# Patient Record
Sex: Female | Born: 1935
Health system: Southern US, Community
[De-identification: ages and names within clinical notes are randomized; demographics above are authoritative.]

## PROBLEM LIST (undated history)

## (undated) DIAGNOSIS — E7800 Pure hypercholesterolemia, unspecified: Secondary | ICD-10-CM

## (undated) HISTORY — PX: CATARACT EXTRACTION W/ INTRAOCULAR LENS  IMPLANT, BILATERAL: SHX1307

## (undated) HISTORY — PX: ABDOMINAL SURGERY: SHX537

## (undated) HISTORY — PX: ABDOMINAL HYSTERECTOMY: SHX81

---

## 1998-08-24 ENCOUNTER — Ambulatory Visit (HOSPITAL_COMMUNITY): Admission: RE | Admit: 1998-08-24 | Discharge: 1998-08-24 | Payer: Self-pay | Admitting: *Deleted

## 1999-04-28 ENCOUNTER — Other Ambulatory Visit: Admission: RE | Admit: 1999-04-28 | Discharge: 1999-04-28 | Payer: Self-pay | Admitting: *Deleted

## 1999-08-29 ENCOUNTER — Encounter: Payer: Self-pay | Admitting: *Deleted

## 1999-08-29 ENCOUNTER — Ambulatory Visit (HOSPITAL_COMMUNITY): Admission: RE | Admit: 1999-08-29 | Discharge: 1999-08-29 | Payer: Self-pay | Admitting: *Deleted

## 1999-12-15 ENCOUNTER — Encounter: Admission: RE | Admit: 1999-12-15 | Discharge: 1999-12-15 | Payer: Self-pay | Admitting: *Deleted

## 1999-12-15 ENCOUNTER — Encounter: Payer: Self-pay | Admitting: *Deleted

## 2000-09-07 ENCOUNTER — Encounter: Payer: Self-pay | Admitting: *Deleted

## 2000-09-07 ENCOUNTER — Encounter: Admission: RE | Admit: 2000-09-07 | Discharge: 2000-09-07 | Payer: Self-pay | Admitting: *Deleted

## 2001-09-12 ENCOUNTER — Encounter: Admission: RE | Admit: 2001-09-12 | Discharge: 2001-09-12 | Payer: Self-pay | Admitting: *Deleted

## 2001-09-12 ENCOUNTER — Encounter: Payer: Self-pay | Admitting: *Deleted

## 2002-06-23 ENCOUNTER — Ambulatory Visit (HOSPITAL_COMMUNITY): Admission: RE | Admit: 2002-06-23 | Discharge: 2002-06-23 | Payer: Self-pay | Admitting: Gastroenterology

## 2002-09-15 ENCOUNTER — Encounter: Admission: RE | Admit: 2002-09-15 | Discharge: 2002-09-15 | Payer: Self-pay | Admitting: *Deleted

## 2002-09-15 ENCOUNTER — Encounter: Payer: Self-pay | Admitting: *Deleted

## 2003-03-19 ENCOUNTER — Other Ambulatory Visit: Admission: RE | Admit: 2003-03-19 | Discharge: 2003-03-19 | Payer: Self-pay | Admitting: *Deleted

## 2003-09-18 ENCOUNTER — Encounter: Admission: RE | Admit: 2003-09-18 | Discharge: 2003-09-18 | Payer: Self-pay | Admitting: *Deleted

## 2004-10-06 ENCOUNTER — Encounter: Admission: RE | Admit: 2004-10-06 | Discharge: 2004-10-06 | Payer: Self-pay | Admitting: *Deleted

## 2005-10-20 ENCOUNTER — Encounter: Admission: RE | Admit: 2005-10-20 | Discharge: 2005-10-20 | Payer: Self-pay | Admitting: *Deleted

## 2006-08-15 ENCOUNTER — Other Ambulatory Visit: Admission: RE | Admit: 2006-08-15 | Discharge: 2006-08-15 | Payer: Self-pay | Admitting: *Deleted

## 2006-10-23 ENCOUNTER — Encounter: Admission: RE | Admit: 2006-10-23 | Discharge: 2006-10-23 | Payer: Self-pay | Admitting: *Deleted

## 2007-08-22 ENCOUNTER — Other Ambulatory Visit: Admission: RE | Admit: 2007-08-22 | Discharge: 2007-08-22 | Payer: Self-pay | Admitting: Family Medicine

## 2007-10-25 ENCOUNTER — Encounter: Admission: RE | Admit: 2007-10-25 | Discharge: 2007-10-25 | Payer: Self-pay | Admitting: Family Medicine

## 2007-11-27 ENCOUNTER — Ambulatory Visit (HOSPITAL_COMMUNITY): Admission: RE | Admit: 2007-11-27 | Discharge: 2007-11-27 | Payer: Self-pay | Admitting: Obstetrics and Gynecology

## 2007-11-27 ENCOUNTER — Encounter (INDEPENDENT_AMBULATORY_CARE_PROVIDER_SITE_OTHER): Payer: Self-pay | Admitting: Obstetrics and Gynecology

## 2008-10-26 ENCOUNTER — Encounter: Admission: RE | Admit: 2008-10-26 | Discharge: 2008-10-26 | Payer: Self-pay | Admitting: Family Medicine

## 2009-10-21 ENCOUNTER — Encounter: Admission: RE | Admit: 2009-10-21 | Discharge: 2009-10-21 | Payer: Self-pay | Admitting: Family Medicine

## 2010-10-24 ENCOUNTER — Encounter
Admission: RE | Admit: 2010-10-24 | Discharge: 2010-10-24 | Payer: Self-pay | Source: Home / Self Care | Attending: Family Medicine | Admitting: Family Medicine

## 2011-03-21 NOTE — Op Note (Signed)
Kendra Davidson, Kendra Davidson                 ACCOUNT NO.:  [id number]   MEDICAL RECORD NO.:  [id number]          PATIENT TYPE:  AMB   LOCATION:  SDC                           FACILITY:  [facility]   PHYSICIAN:  Gerald Leitz, MD          DATE OF BIRTH:  07-Jun-1924   DATE OF PROCEDURE:  11/27/2007  DATE OF DISCHARGE:                               OPERATIVE REPORT   PREOPERATIVE DIAGNOSIS:  Postmenopausal bleeding, suspect endometrial  polyps.   POSTOPERATIVE DIAGNOSIS:  Postmenopausal bleeding, suspect endometrial  fibroids.   PROCEDURE:  Hysteroscopy, dilation and curettage, resection of  endometrial mass.   SURGEON:  Dr. Gerald Leitz.   ASSISTANT:  None.   ANESTHESIA:  MAC.   FINDINGS:  Endometrial mass, suspected fibroids.   SPECIMENS:  Endometrial curettings and endometrial mass.   DISPOSITION:  Specimen sent to pathology.   ESTIMATED BLOOD LOSS:  Minimal.   SORBITOL DEFICIT:  200 mL.   COMPLICATIONS:  None.   INDICATIONS:  This is an 77 year old with postmenopausal bleeding,  thickened endometrium, and suspected endometrial mass on ultrasound.  Informed consent was obtained.   PROCEDURE:  The patient was taken to the operating room where she was  placed under MAC anesthesia.  She was prepped and draped in the usual  sterile fashion.  A bivalve speculum was placed into the vaginal vault.  The cervix was grasped with a single-tooth tenaculum.  A paracervical  block was obtained with 10 mL of 0.25% Marcaine 5 mL injected at the 5  and 7 o'clock positions.  The uterus was sounded to 6 cm.  The cervix  was dilated with Hegar dilators up to a #15 dilator.  The hysteroscope  was introduced into the cervix with the findings of an endometrial mass,  suspected fibroid versus polyp.  The hysteroscope was removed and polyp  forceps were introduced.  The mass was unable to be removed with polyp  forceps.  The hysteroscope was then used.  Prior to insertion of the  hysteroscope, the cervix was  dilated to a #20 Hegar dilator.  The  hysteroscope was then inserted and the endometrial  mass was resected without difficulty.  All instruments were removed from  the patient's vagina.  Excellent hemostasis was noted.  The patient was  awakened from anesthesia and taken to the recovery room awake and in  stable condition.      Gerald Leitz, MD  Electronically Signed     TC/MEDQ  D:  11/27/2007  T:  11/27/2007  Job:  [id number]

## 2011-03-21 NOTE — H&P (Signed)
Kendra Davidson, Kendra Davidson                 ACCOUNT NO.:  [id number]   MEDICAL RECORD NO.:  [id number]           PATIENT TYPE:   LOCATION:                                FACILITY:  [facility]   PHYSICIAN:  Gerald Leitz, MD          DATE OF BIRTH:  [birth year]   DATE OF ADMISSION:  DATE OF DISCHARGE:                              HISTORY & PHYSICAL   This is an 77 year old G zero with postmenopausal bleeding.  She has had  spotting on a daily basis.  Ultrasound performed on December 8 shows the  endometrium measured 1.44 cm.  The patient has multiple fibroids, the  largest of which is 3.2 cm.  She also had two hyperechoic masses  identified in the endometrium, one was 1.3 and 1.4 cm, with positive  blood flow consistent with possible polyp, left ovary appeared normal,  right ovary surgically absent.   PAST OB HISTORY:  Negative.   PAST GYN HISTORY:  No history of sexually transmitted diseases.  No  history of abnormal Pap smears.   PAST MEDICAL HISTORY:  Hypercholesterolemia.   PAST SURGICAL HISTORY:  Right salpingo-oophorectomy in 1964, D and C x2.   MEDICATIONS:  1. Zocor.  2. Aspirin.   ALLERGIES:  CODEINE, SULFA, questionable reaction to aspirin.   SOCIAL HISTORY:  The patient is widowed.  She denies tobacco, alcohol or  illicit drug use.   FAMILY HISTORY:  Positive for sister with breast cancer diagnosed at the  age of 59.   REVIEW OF SYSTEMS:  Negative except as stated in history of current  illness.   PHYSICAL EXAM:  VITAL SIGNS:  Blood pressure 128/74, weight 117 pounds.  CARDIOVASCULAR:  Regular rate and rhythm.  LUNGS:  Clear to auscultation bilaterally.  ABDOMEN:  Soft, nontender, nondistended.  No masses.  PELVIC:  Atrophic external female genitalia.  No vulvar, vaginal or  cervical lesions noted.  No blood in vaginal vault.  Bimanual exam  reveals normal size uterus with no adnexal masses or tenderness.   IMPRESSION AND PLAN:  Postmenopausal bleeding most likely secondary  to  endometrial polyps.  Recommend hysteroscopy, D and C and removal of  polyps.  The patient voiced understanding.  Risks, benefits and  alternatives of  surgery were discussed with the patient including but not limited to  infection, bleeding, possible uterine perforation with need for surgery.  The patient voiced understanding and desires to proceed with  hysteroscopy, D and C and removal of endometrial polyps.      Gerald Leitz, MD  Electronically Signed     TC/MEDQ  D:  11/22/2007  T:  11/22/2007  Job:  [id number]

## 2011-03-24 NOTE — Op Note (Signed)
   Kendra Davidson, Kendra Davidson                          ACCOUNT NO.:  [id number]   MEDICAL RECORD NO.:  [id number]                   PATIENT TYPE:  AMB   LOCATION:  ENDO                                 FACILITY:  [facility]   PHYSICIAN:  James L. Malon Kindle., M.D.          DATE OF BIRTH:  [birth year]   DATE OF PROCEDURE:  DATE OF DISCHARGE:                                 OPERATIVE REPORT   PROCEDURE:  Colonoscopy.   MEDICATIONS:  Fentanyl 50 mcg, Versed 6 mg IV.   INDICATIONS FOR PROCEDURE:  Colon cancer screening.   SCOPE:  Olympus pediatric video colonoscope.   DESCRIPTION OF PROCEDURE:  The procedure had been explained to the patient  and consent obtained. With the patient in the left lateral decubitus  position, the Olympus pediatric video colonoscope was inserted and advanced  under direct visualization. The prep was excellent. We were able to reach  the cecum without difficulty. The ileocecal valve and appendiceal orifice  were seen. The scope was withdrawn and the cecum, ascending colon, hepatic  flexure, transverse colon, splenic flexure, descending and sigmoid colon  were seen well. Only scattered diverticula, no polyps were seen throughout  the entire colon. The rectum was also examined and was free of polyps. The  scope was withdrawn and the patient tolerated the procedure well.   ASSESSMENT:  Essentially normal colonoscopy with no polyps.   PLAN:  Would recommend yearly Hemoccults. Consider screening colon again  with a colonoscopy or sigmoidoscopy in 5-10 years.                                               James L. Malon Kindle., M.D.    [name]  D:  06/23/2002  T:  06/23/2002  Job:  [id number]   cc:   Al Decant. Janey Greaser, M.D.

## 2011-07-27 LAB — CBC
MCV: 91.8
Platelets: 227
RDW: 12.7
WBC: 8.9

## 2011-07-27 LAB — URINALYSIS, ROUTINE W REFLEX MICROSCOPIC
Ketones, ur: NEGATIVE
Nitrite: NEGATIVE
Protein, ur: NEGATIVE
Urobilinogen, UA: 0.2

## 2011-07-27 LAB — DIFFERENTIAL
Basophils Absolute: 0
Basophils Relative: 0
Eosinophils Absolute: 0.1
Lymphs Abs: 2
Neutrophils Relative %: 68

## 2011-07-27 LAB — BASIC METABOLIC PANEL
BUN: 14
Chloride: 102
Creatinine, Ser: 0.79
Glucose, Bld: 96

## 2011-11-28 ENCOUNTER — Other Ambulatory Visit: Payer: Self-pay | Admitting: Family Medicine

## 2011-11-28 DIAGNOSIS — Z1231 Encounter for screening mammogram for malignant neoplasm of breast: Secondary | ICD-10-CM

## 2011-12-27 ENCOUNTER — Ambulatory Visit: Payer: Self-pay

## 2011-12-27 ENCOUNTER — Ambulatory Visit
Admission: RE | Admit: 2011-12-27 | Discharge: 2011-12-27 | Disposition: A | Discharge status: Home / Self Care | Payer: Medicare Other | Source: Ambulatory Visit | Attending: Family Medicine | Admitting: Family Medicine

## 2011-12-27 DIAGNOSIS — Z1231 Encounter for screening mammogram for malignant neoplasm of breast: Secondary | ICD-10-CM

## 2014-12-29 ENCOUNTER — Other Ambulatory Visit: Payer: Self-pay | Admitting: Family Medicine

## 2014-12-30 ENCOUNTER — Other Ambulatory Visit: Payer: Self-pay | Admitting: Family Medicine

## 2014-12-30 DIAGNOSIS — R0989 Other specified symptoms and signs involving the circulatory and respiratory systems: Secondary | ICD-10-CM

## 2014-12-31 LAB — CYTOLOGY - PAP

## 2015-01-04 ENCOUNTER — Ambulatory Visit
Admission: RE | Admit: 2015-01-04 | Discharge: 2015-01-04 | Disposition: A | Discharge status: Home / Self Care | Payer: Medicare Other | Source: Ambulatory Visit | Attending: Family Medicine | Admitting: Family Medicine

## 2015-01-04 DIAGNOSIS — R0989 Other specified symptoms and signs involving the circulatory and respiratory systems: Secondary | ICD-10-CM

## 2016-01-18 DIAGNOSIS — H401112 Primary open-angle glaucoma, right eye, moderate stage: Secondary | ICD-10-CM | POA: Diagnosis not present

## 2016-01-18 DIAGNOSIS — H401122 Primary open-angle glaucoma, left eye, moderate stage: Secondary | ICD-10-CM | POA: Diagnosis not present

## 2016-01-18 DIAGNOSIS — H02102 Unspecified ectropion of right lower eyelid: Secondary | ICD-10-CM | POA: Diagnosis not present

## 2016-01-18 DIAGNOSIS — H02403 Unspecified ptosis of bilateral eyelids: Secondary | ICD-10-CM | POA: Diagnosis not present

## 2016-01-25 DIAGNOSIS — R69 Illness, unspecified: Secondary | ICD-10-CM | POA: Diagnosis not present

## 2016-02-28 DIAGNOSIS — H16213 Exposure keratoconjunctivitis, bilateral: Secondary | ICD-10-CM | POA: Diagnosis not present

## 2016-02-28 DIAGNOSIS — H02115 Cicatricial ectropion of left lower eyelid: Secondary | ICD-10-CM | POA: Diagnosis not present

## 2016-02-28 DIAGNOSIS — H02535 Eyelid retraction left lower eyelid: Secondary | ICD-10-CM | POA: Diagnosis not present

## 2016-02-28 DIAGNOSIS — H02532 Eyelid retraction right lower eyelid: Secondary | ICD-10-CM | POA: Diagnosis not present

## 2016-02-28 DIAGNOSIS — H11433 Conjunctival hyperemia, bilateral: Secondary | ICD-10-CM | POA: Diagnosis not present

## 2016-02-28 DIAGNOSIS — H02112 Cicatricial ectropion of right lower eyelid: Secondary | ICD-10-CM | POA: Diagnosis not present

## 2016-04-10 DIAGNOSIS — H02535 Eyelid retraction left lower eyelid: Secondary | ICD-10-CM | POA: Diagnosis not present

## 2016-04-10 DIAGNOSIS — H02112 Cicatricial ectropion of right lower eyelid: Secondary | ICD-10-CM | POA: Diagnosis not present

## 2016-04-10 DIAGNOSIS — H01003 Unspecified blepharitis right eye, unspecified eyelid: Secondary | ICD-10-CM | POA: Diagnosis not present

## 2016-04-10 DIAGNOSIS — H00023 Hordeolum internum right eye, unspecified eyelid: Secondary | ICD-10-CM | POA: Diagnosis not present

## 2016-04-10 DIAGNOSIS — H02135 Senile ectropion of left lower eyelid: Secondary | ICD-10-CM | POA: Diagnosis not present

## 2016-04-10 DIAGNOSIS — H02132 Senile ectropion of right lower eyelid: Secondary | ICD-10-CM | POA: Diagnosis not present

## 2016-04-10 DIAGNOSIS — H01006 Unspecified blepharitis left eye, unspecified eyelid: Secondary | ICD-10-CM | POA: Diagnosis not present

## 2016-04-10 DIAGNOSIS — H0289 Other specified disorders of eyelid: Secondary | ICD-10-CM | POA: Diagnosis not present

## 2016-04-10 DIAGNOSIS — H00026 Hordeolum internum left eye, unspecified eyelid: Secondary | ICD-10-CM | POA: Diagnosis not present

## 2016-04-10 DIAGNOSIS — H02115 Cicatricial ectropion of left lower eyelid: Secondary | ICD-10-CM | POA: Diagnosis not present

## 2016-04-10 DIAGNOSIS — H02532 Eyelid retraction right lower eyelid: Secondary | ICD-10-CM | POA: Diagnosis not present

## 2016-04-10 DIAGNOSIS — H04523 Eversion of bilateral lacrimal punctum: Secondary | ICD-10-CM | POA: Diagnosis not present

## 2016-04-19 DIAGNOSIS — Z0000 Encounter for general adult medical examination without abnormal findings: Secondary | ICD-10-CM | POA: Diagnosis not present

## 2016-04-19 DIAGNOSIS — M859 Disorder of bone density and structure, unspecified: Secondary | ICD-10-CM | POA: Diagnosis not present

## 2016-04-19 DIAGNOSIS — M8580 Other specified disorders of bone density and structure, unspecified site: Secondary | ICD-10-CM | POA: Diagnosis not present

## 2016-04-19 DIAGNOSIS — E782 Mixed hyperlipidemia: Secondary | ICD-10-CM | POA: Diagnosis not present

## 2016-04-26 DIAGNOSIS — N63 Unspecified lump in breast: Secondary | ICD-10-CM | POA: Diagnosis not present

## 2016-04-26 DIAGNOSIS — M8580 Other specified disorders of bone density and structure, unspecified site: Secondary | ICD-10-CM | POA: Diagnosis not present

## 2016-04-26 DIAGNOSIS — Z8542 Personal history of malignant neoplasm of other parts of uterus: Secondary | ICD-10-CM | POA: Diagnosis not present

## 2016-04-26 DIAGNOSIS — E782 Mixed hyperlipidemia: Secondary | ICD-10-CM | POA: Diagnosis not present

## 2016-04-26 DIAGNOSIS — R69 Illness, unspecified: Secondary | ICD-10-CM | POA: Diagnosis not present

## 2016-04-26 DIAGNOSIS — Z1389 Encounter for screening for other disorder: Secondary | ICD-10-CM | POA: Diagnosis not present

## 2016-04-26 DIAGNOSIS — Z0000 Encounter for general adult medical examination without abnormal findings: Secondary | ICD-10-CM | POA: Diagnosis not present

## 2016-04-26 DIAGNOSIS — L719 Rosacea, unspecified: Secondary | ICD-10-CM | POA: Diagnosis not present

## 2016-05-01 DIAGNOSIS — H02413 Mechanical ptosis of bilateral eyelids: Secondary | ICD-10-CM | POA: Diagnosis not present

## 2016-05-01 DIAGNOSIS — H02831 Dermatochalasis of right upper eyelid: Secondary | ICD-10-CM | POA: Diagnosis not present

## 2016-07-12 DIAGNOSIS — Z23 Encounter for immunization: Secondary | ICD-10-CM | POA: Diagnosis not present

## 2016-07-25 DIAGNOSIS — Z961 Presence of intraocular lens: Secondary | ICD-10-CM | POA: Diagnosis not present

## 2016-07-25 DIAGNOSIS — H401122 Primary open-angle glaucoma, left eye, moderate stage: Secondary | ICD-10-CM | POA: Diagnosis not present

## 2016-07-25 DIAGNOSIS — H401111 Primary open-angle glaucoma, right eye, mild stage: Secondary | ICD-10-CM | POA: Diagnosis not present

## 2016-09-12 DIAGNOSIS — L908 Other atrophic disorders of skin: Secondary | ICD-10-CM | POA: Diagnosis not present

## 2016-09-12 DIAGNOSIS — E782 Mixed hyperlipidemia: Secondary | ICD-10-CM | POA: Diagnosis not present

## 2016-09-20 DIAGNOSIS — J069 Acute upper respiratory infection, unspecified: Secondary | ICD-10-CM | POA: Diagnosis not present

## 2017-01-10 DIAGNOSIS — J208 Acute bronchitis due to other specified organisms: Secondary | ICD-10-CM | POA: Diagnosis not present

## 2017-01-16 DIAGNOSIS — R69 Illness, unspecified: Secondary | ICD-10-CM | POA: Diagnosis not present

## 2017-01-31 DIAGNOSIS — H5340 Unspecified visual field defects: Secondary | ICD-10-CM | POA: Diagnosis not present

## 2017-01-31 DIAGNOSIS — H401132 Primary open-angle glaucoma, bilateral, moderate stage: Secondary | ICD-10-CM | POA: Diagnosis not present

## 2017-04-23 DIAGNOSIS — E782 Mixed hyperlipidemia: Secondary | ICD-10-CM | POA: Diagnosis not present

## 2017-04-30 DIAGNOSIS — R69 Illness, unspecified: Secondary | ICD-10-CM | POA: Diagnosis not present

## 2017-04-30 DIAGNOSIS — M8580 Other specified disorders of bone density and structure, unspecified site: Secondary | ICD-10-CM | POA: Diagnosis not present

## 2017-04-30 DIAGNOSIS — L719 Rosacea, unspecified: Secondary | ICD-10-CM | POA: Diagnosis not present

## 2017-04-30 DIAGNOSIS — E782 Mixed hyperlipidemia: Secondary | ICD-10-CM | POA: Diagnosis not present

## 2017-04-30 DIAGNOSIS — Z1389 Encounter for screening for other disorder: Secondary | ICD-10-CM | POA: Diagnosis not present

## 2017-04-30 DIAGNOSIS — Z0000 Encounter for general adult medical examination without abnormal findings: Secondary | ICD-10-CM | POA: Diagnosis not present

## 2017-04-30 DIAGNOSIS — L989 Disorder of the skin and subcutaneous tissue, unspecified: Secondary | ICD-10-CM | POA: Diagnosis not present

## 2017-05-15 DIAGNOSIS — L570 Actinic keratosis: Secondary | ICD-10-CM | POA: Diagnosis not present

## 2017-05-15 DIAGNOSIS — L820 Inflamed seborrheic keratosis: Secondary | ICD-10-CM | POA: Diagnosis not present

## 2017-05-15 DIAGNOSIS — D225 Melanocytic nevi of trunk: Secondary | ICD-10-CM | POA: Diagnosis not present

## 2017-05-15 DIAGNOSIS — C44712 Basal cell carcinoma of skin of right lower limb, including hip: Secondary | ICD-10-CM | POA: Diagnosis not present

## 2017-05-15 DIAGNOSIS — X32XXXA Exposure to sunlight, initial encounter: Secondary | ICD-10-CM | POA: Diagnosis not present

## 2017-07-18 DIAGNOSIS — Z08 Encounter for follow-up examination after completed treatment for malignant neoplasm: Secondary | ICD-10-CM | POA: Diagnosis not present

## 2017-07-18 DIAGNOSIS — Z85828 Personal history of other malignant neoplasm of skin: Secondary | ICD-10-CM | POA: Diagnosis not present

## 2017-07-27 DIAGNOSIS — Z23 Encounter for immunization: Secondary | ICD-10-CM | POA: Diagnosis not present

## 2017-08-04 DIAGNOSIS — R69 Illness, unspecified: Secondary | ICD-10-CM | POA: Diagnosis not present

## 2017-08-06 DIAGNOSIS — H353131 Nonexudative age-related macular degeneration, bilateral, early dry stage: Secondary | ICD-10-CM | POA: Diagnosis not present

## 2017-08-06 DIAGNOSIS — H401132 Primary open-angle glaucoma, bilateral, moderate stage: Secondary | ICD-10-CM | POA: Diagnosis not present

## 2018-02-04 DIAGNOSIS — H401132 Primary open-angle glaucoma, bilateral, moderate stage: Secondary | ICD-10-CM | POA: Diagnosis not present

## 2018-04-09 DIAGNOSIS — R69 Illness, unspecified: Secondary | ICD-10-CM | POA: Diagnosis not present

## 2018-05-02 DIAGNOSIS — R6889 Other general symptoms and signs: Secondary | ICD-10-CM | POA: Diagnosis not present

## 2018-05-02 DIAGNOSIS — Z136 Encounter for screening for cardiovascular disorders: Secondary | ICD-10-CM | POA: Diagnosis not present

## 2018-05-02 DIAGNOSIS — R5383 Other fatigue: Secondary | ICD-10-CM | POA: Diagnosis not present

## 2018-05-02 DIAGNOSIS — M8580 Other specified disorders of bone density and structure, unspecified site: Secondary | ICD-10-CM | POA: Diagnosis not present

## 2018-05-02 DIAGNOSIS — L719 Rosacea, unspecified: Secondary | ICD-10-CM | POA: Diagnosis not present

## 2018-05-02 DIAGNOSIS — R69 Illness, unspecified: Secondary | ICD-10-CM | POA: Diagnosis not present

## 2018-05-02 DIAGNOSIS — Z8542 Personal history of malignant neoplasm of other parts of uterus: Secondary | ICD-10-CM | POA: Diagnosis not present

## 2018-05-02 DIAGNOSIS — Z0000 Encounter for general adult medical examination without abnormal findings: Secondary | ICD-10-CM | POA: Diagnosis not present

## 2018-05-02 DIAGNOSIS — E782 Mixed hyperlipidemia: Secondary | ICD-10-CM | POA: Diagnosis not present

## 2018-05-02 DIAGNOSIS — Z889 Allergy status to unspecified drugs, medicaments and biological substances status: Secondary | ICD-10-CM | POA: Diagnosis not present

## 2018-07-18 DIAGNOSIS — E7800 Pure hypercholesterolemia, unspecified: Secondary | ICD-10-CM | POA: Diagnosis not present

## 2018-08-01 DIAGNOSIS — Z23 Encounter for immunization: Secondary | ICD-10-CM | POA: Diagnosis not present

## 2018-08-06 DIAGNOSIS — Z961 Presence of intraocular lens: Secondary | ICD-10-CM | POA: Diagnosis not present

## 2018-08-06 DIAGNOSIS — H401111 Primary open-angle glaucoma, right eye, mild stage: Secondary | ICD-10-CM | POA: Diagnosis not present

## 2018-08-06 DIAGNOSIS — H524 Presbyopia: Secondary | ICD-10-CM | POA: Diagnosis not present

## 2018-08-06 DIAGNOSIS — H401123 Primary open-angle glaucoma, left eye, severe stage: Secondary | ICD-10-CM | POA: Diagnosis not present

## 2018-12-30 DIAGNOSIS — E782 Mixed hyperlipidemia: Secondary | ICD-10-CM | POA: Diagnosis not present

## 2018-12-30 DIAGNOSIS — R69 Illness, unspecified: Secondary | ICD-10-CM | POA: Diagnosis not present

## 2018-12-30 DIAGNOSIS — L719 Rosacea, unspecified: Secondary | ICD-10-CM | POA: Diagnosis not present

## 2018-12-30 DIAGNOSIS — Z889 Allergy status to unspecified drugs, medicaments and biological substances status: Secondary | ICD-10-CM | POA: Diagnosis not present

## 2018-12-30 DIAGNOSIS — R079 Chest pain, unspecified: Secondary | ICD-10-CM | POA: Diagnosis not present

## 2018-12-30 DIAGNOSIS — R011 Cardiac murmur, unspecified: Secondary | ICD-10-CM | POA: Diagnosis not present

## 2018-12-31 ENCOUNTER — Other Ambulatory Visit (HOSPITAL_COMMUNITY): Payer: Self-pay | Admitting: Family Medicine

## 2018-12-31 ENCOUNTER — Other Ambulatory Visit: Payer: Self-pay | Admitting: Family Medicine

## 2018-12-31 ENCOUNTER — Ambulatory Visit (HOSPITAL_COMMUNITY): Payer: Medicare HMO | Attending: Cardiovascular Disease

## 2018-12-31 DIAGNOSIS — R011 Cardiac murmur, unspecified: Secondary | ICD-10-CM | POA: Diagnosis not present

## 2019-01-07 DIAGNOSIS — R69 Illness, unspecified: Secondary | ICD-10-CM | POA: Diagnosis not present

## 2019-07-24 DIAGNOSIS — Z23 Encounter for immunization: Secondary | ICD-10-CM | POA: Diagnosis not present

## 2019-08-07 DIAGNOSIS — E878 Other disorders of electrolyte and fluid balance, not elsewhere classified: Secondary | ICD-10-CM | POA: Diagnosis not present

## 2019-08-07 DIAGNOSIS — E7800 Pure hypercholesterolemia, unspecified: Secondary | ICD-10-CM | POA: Diagnosis not present

## 2019-08-07 DIAGNOSIS — E782 Mixed hyperlipidemia: Secondary | ICD-10-CM | POA: Diagnosis not present

## 2019-08-14 DIAGNOSIS — E7800 Pure hypercholesterolemia, unspecified: Secondary | ICD-10-CM | POA: Diagnosis not present

## 2019-08-14 DIAGNOSIS — Z0000 Encounter for general adult medical examination without abnormal findings: Secondary | ICD-10-CM | POA: Diagnosis not present

## 2019-08-14 DIAGNOSIS — R69 Illness, unspecified: Secondary | ICD-10-CM | POA: Diagnosis not present

## 2019-08-14 DIAGNOSIS — Z889 Allergy status to unspecified drugs, medicaments and biological substances status: Secondary | ICD-10-CM | POA: Diagnosis not present

## 2019-08-19 DIAGNOSIS — H401122 Primary open-angle glaucoma, left eye, moderate stage: Secondary | ICD-10-CM | POA: Diagnosis not present

## 2019-08-19 DIAGNOSIS — Z961 Presence of intraocular lens: Secondary | ICD-10-CM | POA: Diagnosis not present

## 2019-08-19 DIAGNOSIS — H401111 Primary open-angle glaucoma, right eye, mild stage: Secondary | ICD-10-CM | POA: Diagnosis not present

## 2019-10-14 DIAGNOSIS — R69 Illness, unspecified: Secondary | ICD-10-CM | POA: Diagnosis not present

## 2019-11-13 DIAGNOSIS — E782 Mixed hyperlipidemia: Secondary | ICD-10-CM | POA: Diagnosis not present

## 2019-11-28 ENCOUNTER — Ambulatory Visit: Payer: Medicare HMO | Attending: Internal Medicine

## 2019-11-28 DIAGNOSIS — Z23 Encounter for immunization: Secondary | ICD-10-CM | POA: Insufficient documentation

## 2019-11-28 NOTE — Progress Notes (Signed)
   Covid-19 Vaccination Clinic  Name:  Kendra Davidson    MRN: [id number] DOB: 1924/02/25  11/28/2019  [name] was observed post Covid-19 immunization for 15 minutes without incidence. She was provided with Vaccine Information Sheet and instruction to access the V-Safe system.   [name] was instructed to call 911 with any severe reactions post vaccine: Marland Kitchen Difficulty breathing  . Swelling of your face and throat  . A fast heartbeat  . A bad rash all over your body  . Dizziness and weakness    Immunizations Administered    Name Date Dose VIS Date Route   Pfizer COVID-19 Vaccine 11/28/2019  1:52 PM 0.3 mL 10/17/2019 Intramuscular   Manufacturer: [place]   Lot: [id number]   [place]: [id number]

## 2019-12-19 ENCOUNTER — Ambulatory Visit: Payer: Medicare HMO | Attending: Internal Medicine

## 2019-12-19 DIAGNOSIS — Z23 Encounter for immunization: Secondary | ICD-10-CM

## 2019-12-19 NOTE — Progress Notes (Signed)
   Covid-19 Vaccination Clinic  Name:  Kendra Davidson    MRN: [id number] DOB: [birth year]  12/19/2019  [name] was observed post Covid-19 immunization for 15 minutes without incidence. She was provided with Vaccine Information Sheet and instruction to access the V-Safe system.   [name] was instructed to call 911 with any severe reactions post vaccine: Marland Kitchen Difficulty breathing  . Swelling of your face and throat  . A fast heartbeat  . A bad rash all over your body  . Dizziness and weakness    Immunizations Administered    Name Date Dose VIS Date Route   Pfizer COVID-19 Vaccine 12/19/2019 10:38 AM 0.3 mL 10/17/2019 Intramuscular   Manufacturer: [place]   Lot: [id number]   [place]: [id number]

## 2020-04-16 ENCOUNTER — Other Ambulatory Visit: Payer: Self-pay

## 2020-04-16 ENCOUNTER — Emergency Department (HOSPITAL_COMMUNITY): Payer: Medicare HMO

## 2020-04-16 ENCOUNTER — Encounter (HOSPITAL_COMMUNITY): Payer: Self-pay

## 2020-04-16 ENCOUNTER — Emergency Department (HOSPITAL_COMMUNITY)
Admission: EM | Admit: 2020-04-16 | Discharge: 2020-04-16 | Disposition: A | Discharge status: Home / Self Care | Payer: Medicare HMO | Attending: Emergency Medicine | Admitting: Emergency Medicine

## 2020-04-16 DIAGNOSIS — Z79899 Other long term (current) drug therapy: Secondary | ICD-10-CM | POA: Diagnosis not present

## 2020-04-16 DIAGNOSIS — G319 Degenerative disease of nervous system, unspecified: Secondary | ICD-10-CM | POA: Diagnosis not present

## 2020-04-16 DIAGNOSIS — Z7982 Long term (current) use of aspirin: Secondary | ICD-10-CM | POA: Diagnosis not present

## 2020-04-16 DIAGNOSIS — R42 Dizziness and giddiness: Secondary | ICD-10-CM | POA: Diagnosis not present

## 2020-04-16 DIAGNOSIS — R519 Headache, unspecified: Secondary | ICD-10-CM | POA: Diagnosis not present

## 2020-04-16 HISTORY — DX: Pure hypercholesterolemia, unspecified: E78.00

## 2020-04-16 LAB — CBC WITH DIFFERENTIAL/PLATELET
Abs Immature Granulocytes: 0.04 10*3/uL (ref 0.00–0.07)
Basophils Absolute: 0 10*3/uL (ref 0.0–0.1)
Basophils Relative: 0 %
Eosinophils Absolute: 0.1 10*3/uL (ref 0.0–0.5)
Eosinophils Relative: 1 %
HCT: 39.7 % (ref 36.0–46.0)
Hemoglobin: 13.1 g/dL (ref 12.0–15.0)
Immature Granulocytes: 1 %
Lymphocytes Relative: 14 %
Lymphs Abs: 1.2 10*3/uL (ref 0.7–4.0)
MCH: 30.5 pg (ref 26.0–34.0)
MCHC: 33 g/dL (ref 30.0–36.0)
MCV: 92.5 fL (ref 80.0–100.0)
Monocytes Absolute: 0.5 10*3/uL (ref 0.1–1.0)
Monocytes Relative: 6 %
Neutro Abs: 6.5 10*3/uL (ref 1.7–7.7)
Neutrophils Relative %: 78 %
Platelets: 191 10*3/uL (ref 150–400)
RBC: 4.29 MIL/uL (ref 3.87–5.11)
RDW: 13.6 % (ref 11.5–15.5)
WBC: 8.3 10*3/uL (ref 4.0–10.5)
nRBC: 0 % (ref 0.0–0.2)

## 2020-04-16 LAB — BASIC METABOLIC PANEL
Anion gap: 9 (ref 5–15)
BUN: 21 mg/dL (ref 8–23)
CO2: 28 mmol/L (ref 22–32)
Calcium: 9.3 mg/dL (ref 8.9–10.3)
Chloride: 104 mmol/L (ref 98–111)
Creatinine, Ser: 0.87 mg/dL (ref 0.44–1.00)
GFR calc Af Amer: >60 mL/min (ref >60)
GFR calc non Af Amer: 57 mL/min — ABNORMAL LOW (ref >60)
Glucose, Bld: 104 mg/dL — ABNORMAL HIGH (ref 70–99)
Potassium: 3.9 mmol/L (ref 3.5–5.1)
Sodium: 141 mmol/L (ref 135–145)

## 2020-04-16 MED ORDER — MECLIZINE HCL 12.5 MG PO TABS: 12.5000 mg | ORAL_TABLET | Freq: Three times a day (TID) | ORAL | 0 refills | Status: AC | PRN

## 2020-04-16 NOTE — ED Provider Notes (Signed)
[facility] Provider Note   CSN: [id number] Arrival date & time: 04/16/20  6237     History Chief Complaint  Patient presents with  . Headache  . Dizziness    Kendra Davidson is a 86 y.o. female.  Presents to emergency department with chief complaint of intermittent episodes of dizziness/unsteadiness.  Patient states for the past week when she wakes up in the morning she feels mildly unsteady, states that she has to hold onto something when walking.  This generally resolves relatively quickly and she can go about her day as normal.  Also has had mild dull posterior headache.  Currently does not have any headache, does not have any sensation of unsteadiness.  She denies dizziness, no room spinning sensation, no numbness, weakness, vision changes.  Denies prior history of stroke.  Lives alone.  Son checked on her this morning and encouraged patient to get checked out.  No fever, neck pain or stiffness.  HPI     Past Medical History:  Diagnosis Date  . High cholesterol     There are no problems to display for this patient.   Past Surgical History:  Procedure Laterality Date  . ABDOMINAL HYSTERECTOMY    . ABDOMINAL SURGERY    . CATARACT EXTRACTION W/ INTRAOCULAR LENS  IMPLANT, BILATERAL       OB History   No obstetric history on file.     History reviewed. No pertinent family history.  Social History   Tobacco Use  . Smoking status: Never Smoker  . Smokeless tobacco: Never Used  Vaping Use  . Vaping Use: Never used  Substance Use Topics  . Alcohol use: Never  . Drug use: Never    Home Medications Prior to Admission medications   Medication Sig Start Date End Date Taking? Authorizing Provider  aspirin EC 81 MG tablet Take 81 mg by mouth daily. Swallow whole.   Yes [provider]  Biotin 5000 MCG CAPS Take 5,000 mcg by mouth daily.   Yes [provider]  calcium citrate-vitamin D (CITRACAL+D) 315-200 MG-UNIT  tablet Take 1 tablet by mouth daily.   Yes [provider]  Echinacea 125 MG TABS Take 760 mg by mouth daily.   Yes [provider]  ezetimibe (ZETIA) 10 MG tablet Take 10 mg by mouth daily. 11/15/19  Yes [provider]  ibuprofen (ADVIL) 200 MG tablet Take 200 mg by mouth as needed for headache.   Yes [provider]  latanoprost (XALATAN) 0.005 % ophthalmic solution Place 1 drop into both eyes at bedtime. 03/25/20  Yes [provider]  LORazepam (ATIVAN) 1 MG tablet Take 0.5 mg by mouth at bedtime. 03/01/20  Yes [provider]  Magnesium 250 MG TABS Take 250 mg by mouth daily.   Yes [provider]  melatonin 5 MG TABS Take 5 mg by mouth at bedtime as needed (sleep aid).   Yes [provider]  Multiple Vitamins-Minerals (CENTRUM SILVER 50+WOMEN PO) Take 1 tablet by mouth daily.   Yes [provider]  Omega-3 Fatty Acids (FISH OIL) 1000 MG CAPS Take 1,000 mg by mouth daily.   Yes [provider]  polyethylene glycol (MIRALAX / GLYCOLAX) 17 g packet Take 17 g by mouth daily as needed for mild constipation.   Yes [provider]  Potassium 99 MG TABS Take 99 mg by mouth daily.   Yes [provider]  Propylene Glycol (SYSTANE BALANCE OP) Apply 1 drop to  eye daily.   Yes [provider]  zinc gluconate 50 MG tablet Take 50 mg by mouth daily.   Yes [provider]  meclizine (ANTIVERT) 12.5 MG tablet Take 1 tablet (12.5 mg total) by mouth 3 (three) times daily as needed for dizziness. 04/16/20   Lucrezia Starch, MD    Allergies    Aspirin, Codeine, and Sulfa antibiotics  Review of Systems   Review of Systems  Constitutional: Negative for chills and fever.  HENT: Negative for ear pain and sore throat.   Eyes: Negative for pain and visual disturbance.  Respiratory: Negative for cough and shortness of breath.   Cardiovascular: Negative for chest pain and palpitations.    Gastrointestinal: Negative for abdominal pain and vomiting.  Genitourinary: Negative for dysuria and hematuria.  Musculoskeletal: Negative for arthralgias and back pain.  Skin: Negative for color change and rash.  Neurological: Positive for headaches. Negative for seizures and syncope.  All other systems reviewed and are negative.   Physical Exam Updated Vital Signs BP (!) 167/79   Pulse 78   Temp 98.1 F (36.7 C) (Oral)   Resp 16   Ht 5' 3.5" (1.613 m)   Wt 49.9 kg   SpO2 98%   BMI 19.18 kg/m   Physical Exam Vitals and nursing note reviewed.  Constitutional:      General: She is not in acute distress.    Appearance: She is well-developed.  HENT:     Head: Normocephalic and atraumatic.     Comments: Normal TMs bilaterally, no tenderness over temporal region bilaterally Eyes:     General: No visual field deficit.    Conjunctiva/sclera: Conjunctivae normal.  Cardiovascular:     Rate and Rhythm: Normal rate and regular rhythm.     Heart sounds: No murmur heard.   Pulmonary:     Effort: Pulmonary effort is normal. No respiratory distress.     Breath sounds: Normal breath sounds.  Abdominal:     Palpations: Abdomen is soft.     Tenderness: There is no abdominal tenderness.  Musculoskeletal:     Cervical back: Neck supple.  Skin:    General: Skin is warm and dry.  Neurological:     Mental Status: She is alert and oriented to person, place, and time.     GCS: GCS eye subscore is 4. GCS verbal subscore is 5. GCS motor subscore is 6.     Cranial Nerves: No cranial nerve deficit, dysarthria or facial asymmetry.     Sensory: No sensory deficit.     Motor: No weakness.     Coordination: Coordination normal.     Gait: Gait normal.     Comments: Ambulatory in room independently without assistance without difficulty  Psychiatric:        Mood and Affect: Mood normal.        Behavior: Behavior normal.     ED Results / Procedures / Treatments   Labs (all labs ordered  are listed, but only abnormal results are displayed) Labs Reviewed  BASIC METABOLIC PANEL - Abnormal; Notable for the following components:      Result Value   Glucose, Bld 104 (*)    GFR calc non Af Amer 57 (*)    All other components within normal limits  CBC WITH DIFFERENTIAL/PLATELET    EKG None  Radiology CT Head Wo Contrast  Result Date: 04/16/2020 CLINICAL DATA:  Unsteadiness, headaches, concern for mass, stroke or bleed; ataxia, stroke suspected. Additional history provided: Patient  reports head pressure and "equilibrium off" for 1 week, worsened today, pain to left side of head EXAM: CT HEAD WITHOUT CONTRAST TECHNIQUE: Contiguous axial images were obtained from the base of the skull through the vertex without intravenous contrast. COMPARISON:  No pertinent prior studies available for comparison. FINDINGS: Brain: There is mild-to-moderate generalized parenchymal atrophy. Mild to moderate ill-defined hypoattenuation within the cerebral white matter is nonspecific, but consistent with chronic small vessel ischemic disease. There is no acute intracranial hemorrhage. No demarcated cortical infarct is identified. No extra-axial fluid collection. No evidence of intracranial mass. No midline shift. Vascular: No hyperdense vessel.  Atherosclerotic calcifications. Skull: Normal. Negative for fracture or focal lesion. Sinuses/Orbits: Visualized orbits show no acute finding. Mild ethmoid sinus mucosal thickening. No significant mastoid effusion. IMPRESSION: No CT evidence of acute intracranial abnormality. Mild-to-moderate generalized parenchymal atrophy and chronic small vessel ischemic disease. Mild ethmoid sinus mucosal thickening. Electronically Signed   By: Kellie Simmering DO   On: 04/16/2020 10:51    Procedures Procedures (including critical care time)  Medications Ordered in ED Medications - No data to display  ED Course  I have reviewed the triage vital signs and the nursing  notes.  Pertinent labs & imaging results that were available during my care of the patient were reviewed by me and considered in my medical decision making (see chart for details).    MDM Rules/Calculators/A&P                          86 year old lady presents to ER with concern for an episode of dizziness.  She is concerned that her "equilibrium" is off.  Episode relatively brief and self resolving.  In ER she has no symptoms.  Normal neurologic exam.  No room spinning sensation.  No ongoing headache.  Labs grossly within normal limits, no electrolyte derangement.  CT head negative.  She is ambulatory without difficulty, unassisted in ER.  Given reassuring work-up today, current clinical appearance, believe patient can be discharged and managed in the outpatient setting.  Recommended recheck with PCP early next week.  If patient were to have worsening headaches or worsening dizziness, would consider MRI brain to further evaluate.  However at this time, given current appearance, exam, no ongoing symptoms, do not feel this needs to be completed today.  Discussed results in detail with patient and son at bedside who are in agreement with plan.    After the discussed management above, the patient was determined to be safe for discharge.  The patient was in agreement with this plan and all questions regarding their care were answered.  ED return precautions were discussed and the patient will return to the ED with any significant worsening of condition.    Final Clinical Impression(s) / ED Diagnoses Final diagnoses:  Episode of dizziness    Rx / DC Orders ED Discharge Orders         Ordered    meclizine (ANTIVERT) 12.5 MG tablet  3 times daily PRN     Discontinue  Reprint     04/16/20 1113           Lucrezia Starch, MD 04/16/20 1617

## 2020-04-16 NOTE — Discharge Instructions (Signed)
Please call your primary care office today to get an appointment scheduled, ideally to be seen on Monday for close recheck.  If you have further episodes of dizziness, can take the prescribed meclizine.  If you have worsening headaches, worsening dizziness or any room spinning sensation, chest pain or difficulty in breathing or other new concerning symptom, please return to ER for reassessment.

## 2020-04-16 NOTE — ED Triage Notes (Signed)
Patient c/o "head pressure and equilibrium off" x 1 week, but worse today.

## 2020-04-19 DIAGNOSIS — R42 Dizziness and giddiness: Secondary | ICD-10-CM | POA: Diagnosis not present

## 2020-05-18 DIAGNOSIS — R69 Illness, unspecified: Secondary | ICD-10-CM | POA: Diagnosis not present

## 2020-07-27 DIAGNOSIS — Z23 Encounter for immunization: Secondary | ICD-10-CM | POA: Diagnosis not present

## 2020-08-25 DIAGNOSIS — H401122 Primary open-angle glaucoma, left eye, moderate stage: Secondary | ICD-10-CM | POA: Diagnosis not present

## 2020-08-25 DIAGNOSIS — H401111 Primary open-angle glaucoma, right eye, mild stage: Secondary | ICD-10-CM | POA: Diagnosis not present

## 2020-08-25 DIAGNOSIS — H5212 Myopia, left eye: Secondary | ICD-10-CM | POA: Diagnosis not present

## 2020-08-25 DIAGNOSIS — Z961 Presence of intraocular lens: Secondary | ICD-10-CM | POA: Diagnosis not present

## 2020-08-26 DIAGNOSIS — Z8542 Personal history of malignant neoplasm of other parts of uterus: Secondary | ICD-10-CM | POA: Diagnosis not present

## 2020-08-26 DIAGNOSIS — E7800 Pure hypercholesterolemia, unspecified: Secondary | ICD-10-CM | POA: Diagnosis not present

## 2020-08-26 DIAGNOSIS — M8580 Other specified disorders of bone density and structure, unspecified site: Secondary | ICD-10-CM | POA: Diagnosis not present

## 2020-08-26 DIAGNOSIS — R69 Illness, unspecified: Secondary | ICD-10-CM | POA: Diagnosis not present

## 2020-08-26 DIAGNOSIS — L719 Rosacea, unspecified: Secondary | ICD-10-CM | POA: Diagnosis not present

## 2020-08-26 DIAGNOSIS — Z0000 Encounter for general adult medical examination without abnormal findings: Secondary | ICD-10-CM | POA: Diagnosis not present

## 2020-08-26 DIAGNOSIS — Z889 Allergy status to unspecified drugs, medicaments and biological substances status: Secondary | ICD-10-CM | POA: Diagnosis not present

## 2020-08-27 DIAGNOSIS — Z889 Allergy status to unspecified drugs, medicaments and biological substances status: Secondary | ICD-10-CM | POA: Diagnosis not present

## 2020-08-27 DIAGNOSIS — R69 Illness, unspecified: Secondary | ICD-10-CM | POA: Diagnosis not present

## 2020-08-27 DIAGNOSIS — Z0000 Encounter for general adult medical examination without abnormal findings: Secondary | ICD-10-CM | POA: Diagnosis not present

## 2020-08-27 DIAGNOSIS — E7800 Pure hypercholesterolemia, unspecified: Secondary | ICD-10-CM | POA: Diagnosis not present

## 2020-08-27 DIAGNOSIS — Z8542 Personal history of malignant neoplasm of other parts of uterus: Secondary | ICD-10-CM | POA: Diagnosis not present

## 2020-08-27 DIAGNOSIS — M8580 Other specified disorders of bone density and structure, unspecified site: Secondary | ICD-10-CM | POA: Diagnosis not present

## 2020-08-27 DIAGNOSIS — L719 Rosacea, unspecified: Secondary | ICD-10-CM | POA: Diagnosis not present

## 2020-09-01 DIAGNOSIS — B078 Other viral warts: Secondary | ICD-10-CM | POA: Diagnosis not present

## 2020-09-01 DIAGNOSIS — X32XXXD Exposure to sunlight, subsequent encounter: Secondary | ICD-10-CM | POA: Diagnosis not present

## 2020-09-01 DIAGNOSIS — L570 Actinic keratosis: Secondary | ICD-10-CM | POA: Diagnosis not present

## 2020-09-01 DIAGNOSIS — L821 Other seborrheic keratosis: Secondary | ICD-10-CM | POA: Diagnosis not present

## 2020-09-01 DIAGNOSIS — L820 Inflamed seborrheic keratosis: Secondary | ICD-10-CM | POA: Diagnosis not present

## 2021-02-07 DIAGNOSIS — F419 Anxiety disorder, unspecified: Secondary | ICD-10-CM | POA: Diagnosis not present

## 2021-02-07 DIAGNOSIS — N1831 Chronic kidney disease, stage 3a: Secondary | ICD-10-CM | POA: Diagnosis not present

## 2021-02-07 DIAGNOSIS — Z889 Allergy status to unspecified drugs, medicaments and biological substances status: Secondary | ICD-10-CM | POA: Diagnosis not present

## 2021-02-07 DIAGNOSIS — E7800 Pure hypercholesterolemia, unspecified: Secondary | ICD-10-CM | POA: Diagnosis not present

## 2021-02-07 DIAGNOSIS — R69 Illness, unspecified: Secondary | ICD-10-CM | POA: Diagnosis not present

## 2021-03-02 DIAGNOSIS — H401111 Primary open-angle glaucoma, right eye, mild stage: Secondary | ICD-10-CM | POA: Diagnosis not present

## 2021-03-02 DIAGNOSIS — H401123 Primary open-angle glaucoma, left eye, severe stage: Secondary | ICD-10-CM | POA: Diagnosis not present

## 2021-03-23 DIAGNOSIS — H401111 Primary open-angle glaucoma, right eye, mild stage: Secondary | ICD-10-CM | POA: Diagnosis not present

## 2021-03-23 DIAGNOSIS — H401123 Primary open-angle glaucoma, left eye, severe stage: Secondary | ICD-10-CM | POA: Diagnosis not present

## 2021-09-05 DIAGNOSIS — G729 Myopathy, unspecified: Secondary | ICD-10-CM | POA: Diagnosis not present

## 2021-09-05 DIAGNOSIS — N1831 Chronic kidney disease, stage 3a: Secondary | ICD-10-CM | POA: Diagnosis not present

## 2021-09-05 DIAGNOSIS — R42 Dizziness and giddiness: Secondary | ICD-10-CM | POA: Diagnosis not present

## 2021-09-05 DIAGNOSIS — Z1389 Encounter for screening for other disorder: Secondary | ICD-10-CM | POA: Diagnosis not present

## 2021-09-05 DIAGNOSIS — R69 Illness, unspecified: Secondary | ICD-10-CM | POA: Diagnosis not present

## 2021-09-05 DIAGNOSIS — E7800 Pure hypercholesterolemia, unspecified: Secondary | ICD-10-CM | POA: Diagnosis not present

## 2021-09-05 DIAGNOSIS — Z0000 Encounter for general adult medical examination without abnormal findings: Secondary | ICD-10-CM | POA: Diagnosis not present

## 2021-10-18 DIAGNOSIS — Z961 Presence of intraocular lens: Secondary | ICD-10-CM | POA: Diagnosis not present

## 2021-10-18 DIAGNOSIS — H401111 Primary open-angle glaucoma, right eye, mild stage: Secondary | ICD-10-CM | POA: Diagnosis not present

## 2021-10-18 DIAGNOSIS — H401123 Primary open-angle glaucoma, left eye, severe stage: Secondary | ICD-10-CM | POA: Diagnosis not present

## 2021-10-18 DIAGNOSIS — H353131 Nonexudative age-related macular degeneration, bilateral, early dry stage: Secondary | ICD-10-CM | POA: Diagnosis not present

## 2022-04-04 IMAGING — CT CT HEAD W/O CM
3 series · 15 of 47 positions shown, 18 images · non-contrast
Comparison: No pertinent prior studies available for comparison.

CLINICAL DATA: Unsteadiness, headaches, concern for mass, stroke or
bleed; ataxia, stroke suspected. Additional history provided:
Patient reports head pressure and "equilibrium off" for 1 week,
worsened today, pain to left side of head

EXAM:
CT HEAD WITHOUT CONTRAST
TECHNIQUE: Contiguous axial images were obtained from the base of the skull
through the vertex without intravenous contrast.

[Series 2: head wo · axial · 0.47mm/px · z∈[-28,+102]mm · 9 of 32 slices shown, 12 images]
[im 3/32  brain]
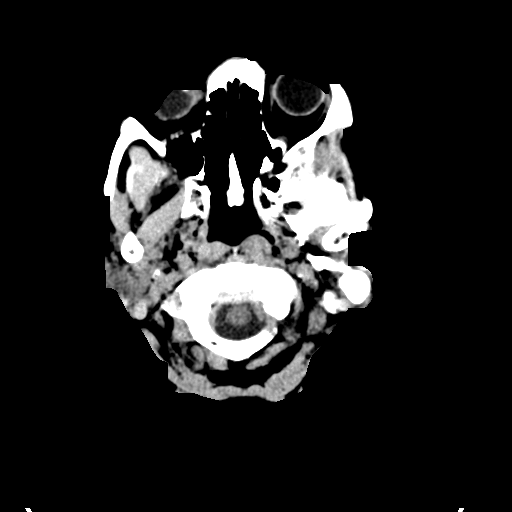
[im 3/32  bone]
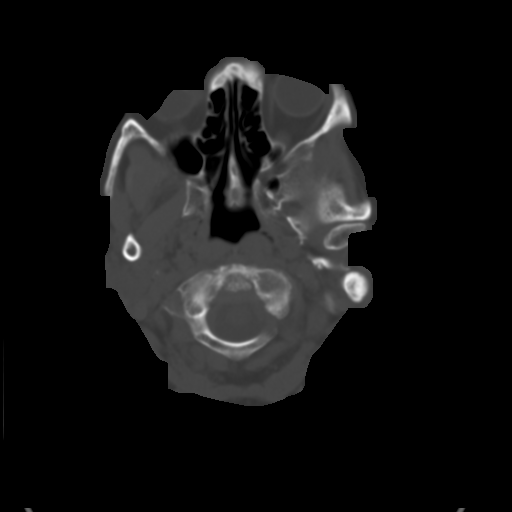
[im 6/32  brain]
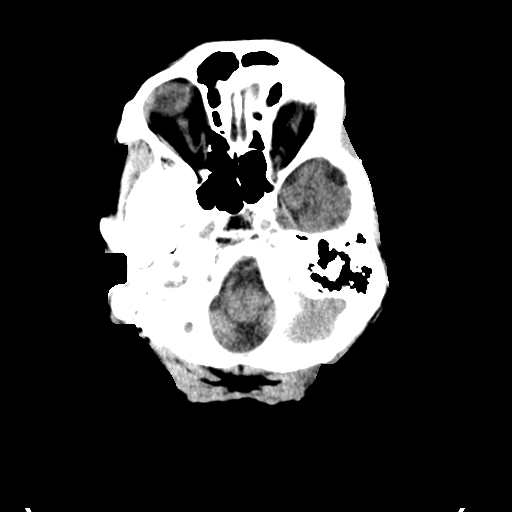
[im 9/32  brain]
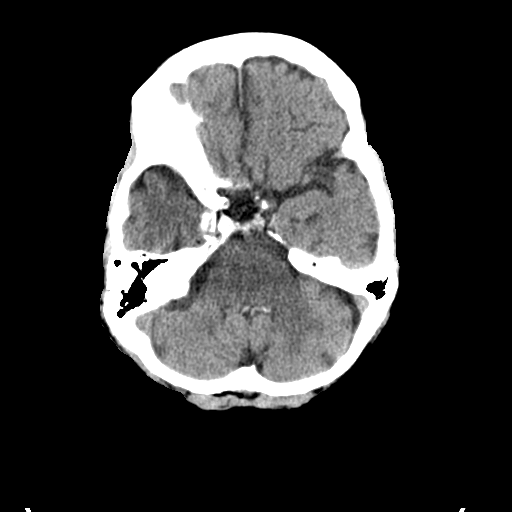
[im 12/32  brain]
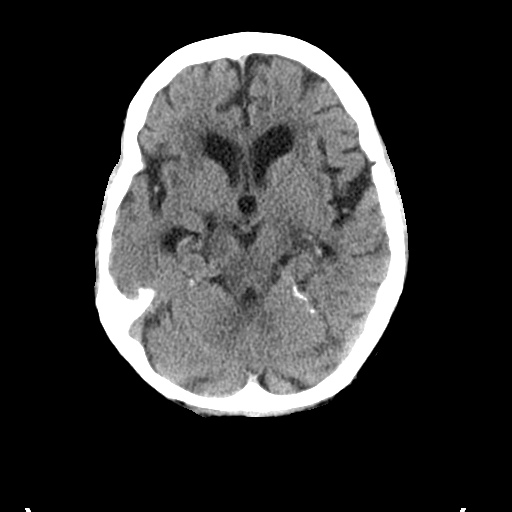
[im 17/32  brain]
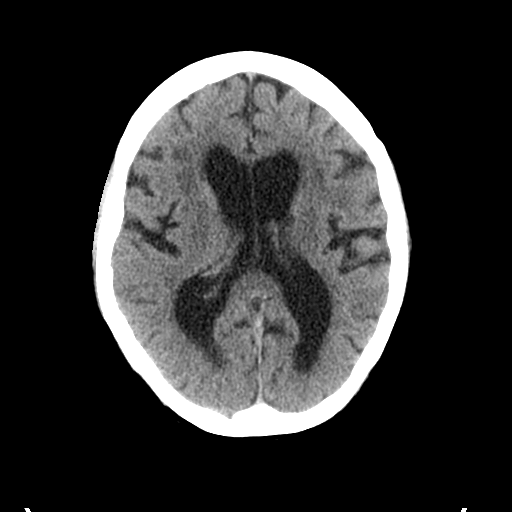
[im 17/32  bone]
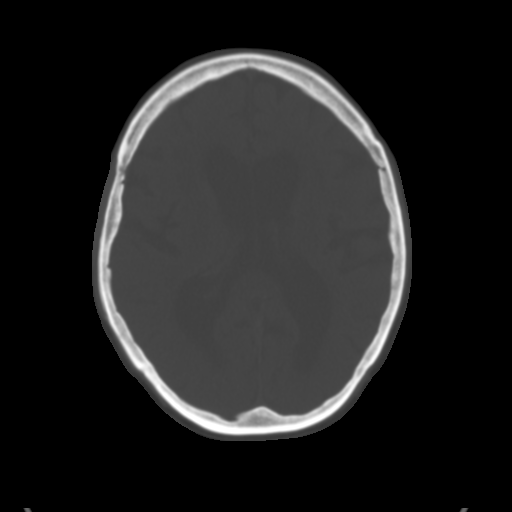
[im 20/32  brain]
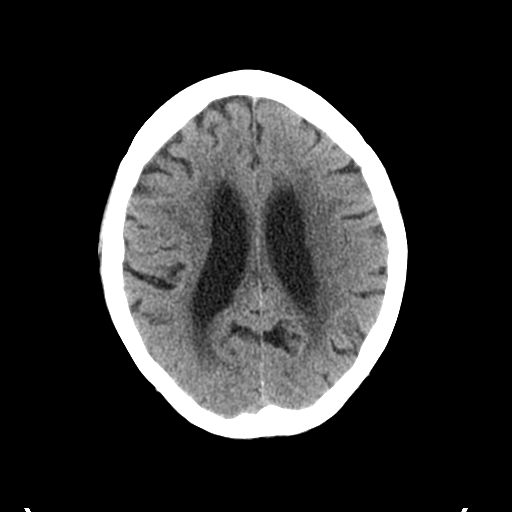
[im 23/32  brain]
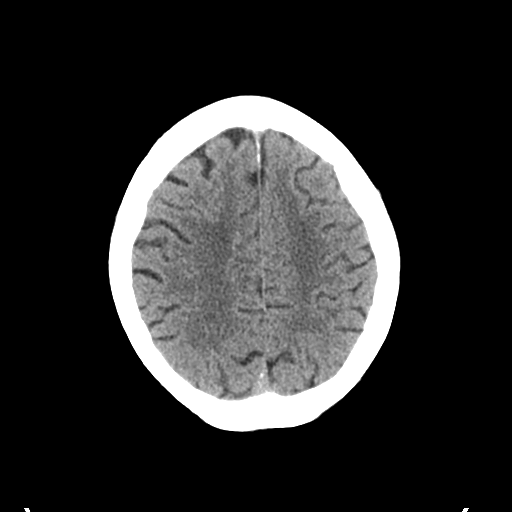
[im 26/32  brain]
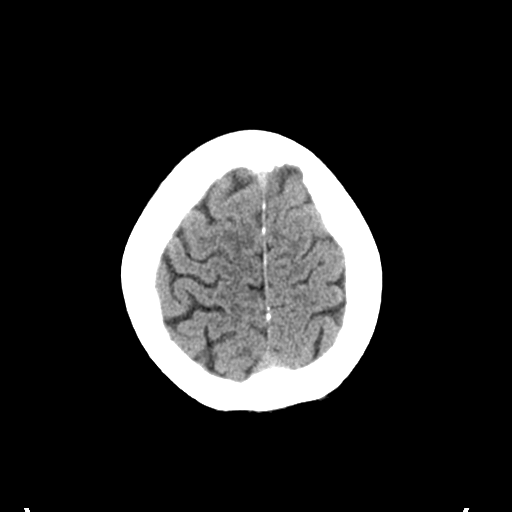
[im 29/32  brain]
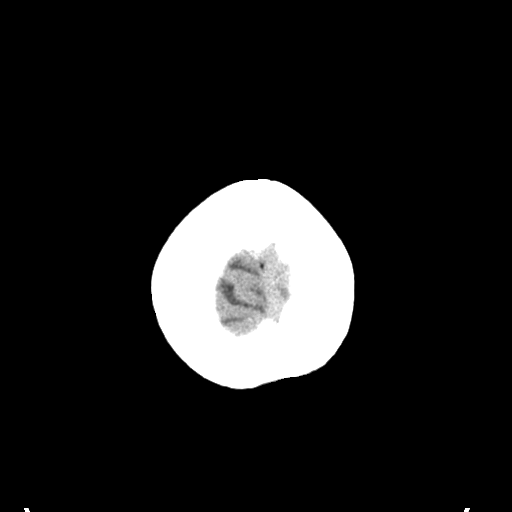
[im 29/32  bone]
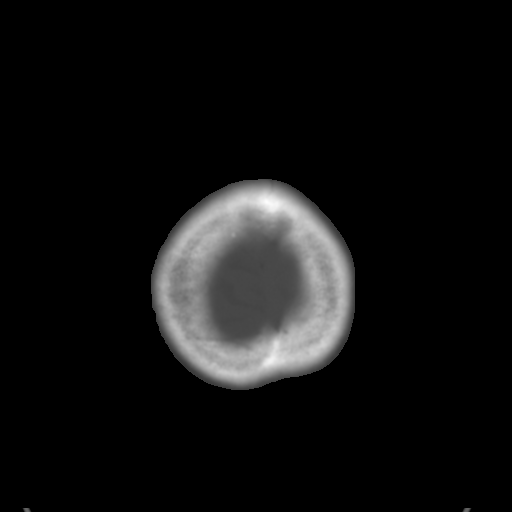

[Series 5: coronal soft tissue · coronal · 0.31mm/px · 3 of 66 slices shown]
[im 22/66  brain]
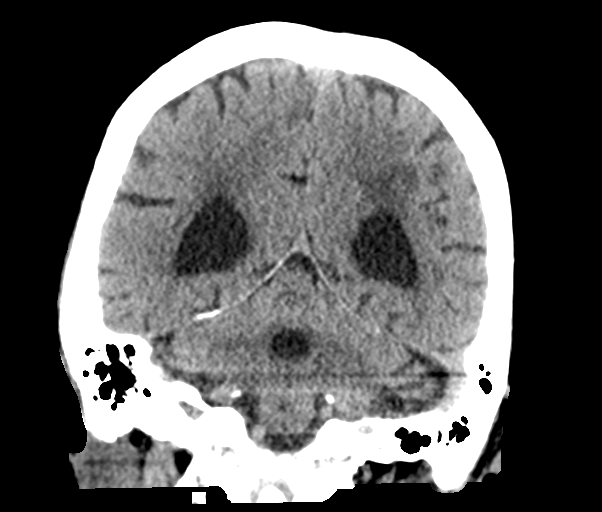
[im 29/66  brain]
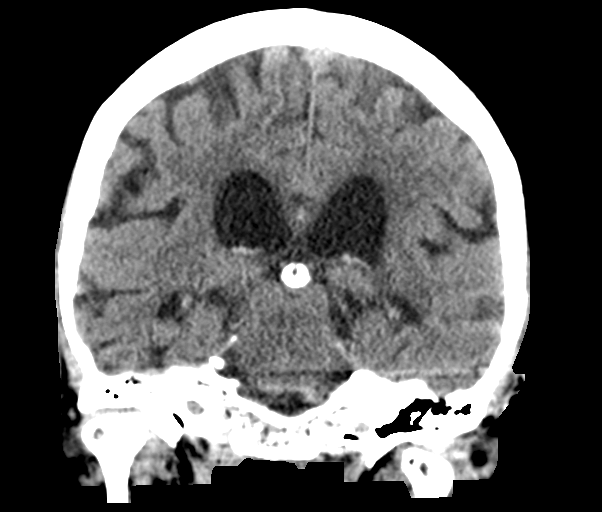
[im 37/66  brain]
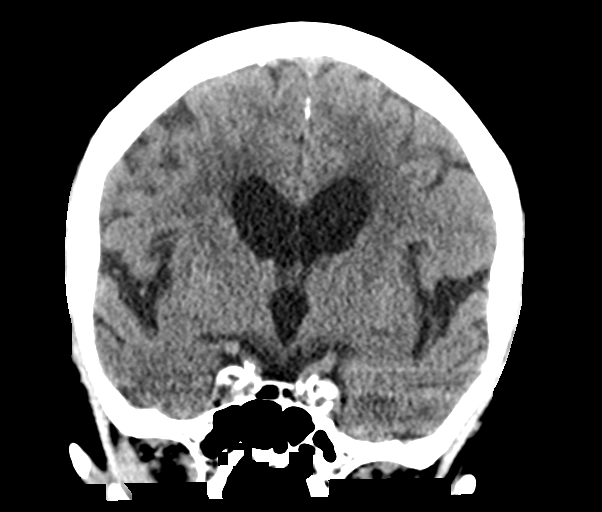

[Series 6: sagittal soft tissue · sagittal · 0.31mm/px · 3 of 58 slices shown]
[im 20/58  brain]
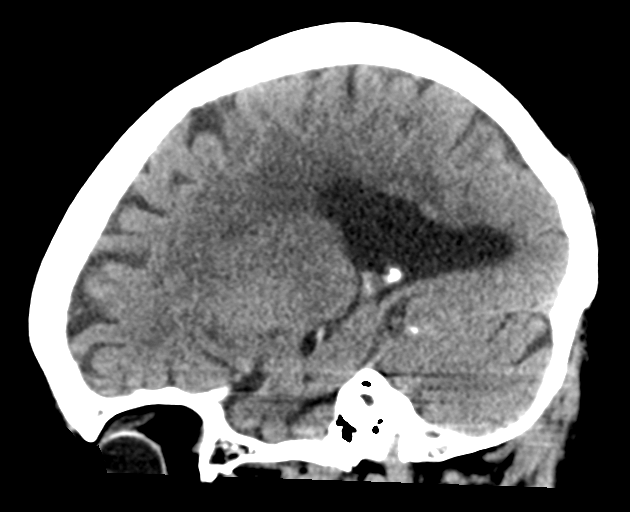
[im 29/58  brain]
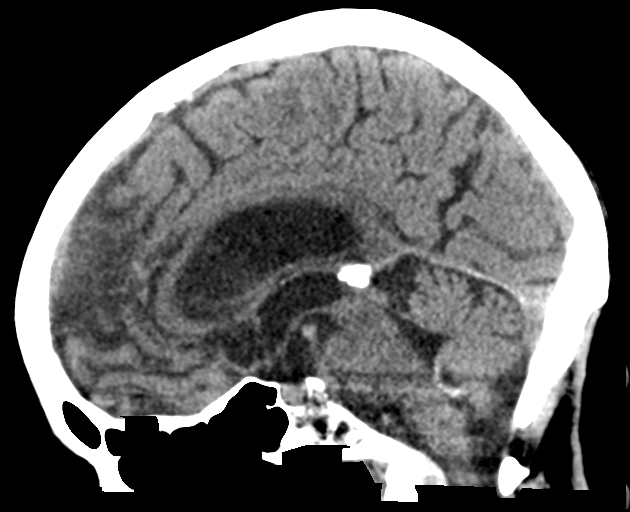
[im 39/58  brain]
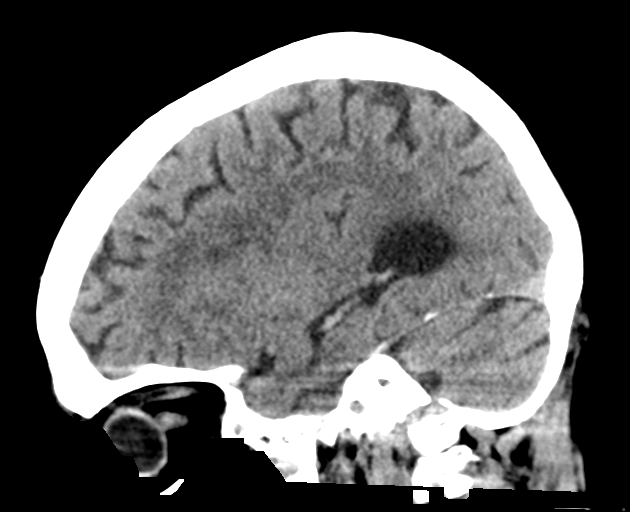

[15 of 47 positions shown; findings below may reference images not displayed]

FINDINGS: Brain:

There is mild-to-moderate generalized parenchymal atrophy.

Mild to moderate ill-defined hypoattenuation within the cerebral
white matter is nonspecific, but consistent with chronic small
vessel ischemic disease.

There is no acute intracranial hemorrhage.

No demarcated cortical infarct is identified.

No extra-axial fluid collection.

No evidence of intracranial mass.

No midline shift.

Vascular: No hyperdense vessel.  Atherosclerotic calcifications.

Skull: Normal. Negative for fracture or focal lesion.

Sinuses/Orbits: Visualized orbits show no acute finding. Mild
ethmoid sinus mucosal thickening. No significant mastoid effusion.
IMPRESSION: No CT evidence of acute intracranial abnormality.

Mild-to-moderate generalized parenchymal atrophy and chronic small
vessel ischemic disease.

Mild ethmoid sinus mucosal thickening.

## 2022-04-19 DIAGNOSIS — H401123 Primary open-angle glaucoma, left eye, severe stage: Secondary | ICD-10-CM | POA: Diagnosis not present

## 2022-04-19 DIAGNOSIS — Z961 Presence of intraocular lens: Secondary | ICD-10-CM | POA: Diagnosis not present

## 2022-04-19 DIAGNOSIS — H401111 Primary open-angle glaucoma, right eye, mild stage: Secondary | ICD-10-CM | POA: Diagnosis not present

## 2022-09-11 DIAGNOSIS — E7800 Pure hypercholesterolemia, unspecified: Secondary | ICD-10-CM | POA: Diagnosis not present

## 2022-09-11 DIAGNOSIS — R69 Illness, unspecified: Secondary | ICD-10-CM | POA: Diagnosis not present

## 2022-09-11 DIAGNOSIS — Z23 Encounter for immunization: Secondary | ICD-10-CM | POA: Diagnosis not present

## 2022-09-11 DIAGNOSIS — Z6820 Body mass index (BMI) 20.0-20.9, adult: Secondary | ICD-10-CM | POA: Diagnosis not present

## 2022-10-12 DIAGNOSIS — Z0000 Encounter for general adult medical examination without abnormal findings: Secondary | ICD-10-CM | POA: Diagnosis not present

## 2022-10-12 DIAGNOSIS — Z1389 Encounter for screening for other disorder: Secondary | ICD-10-CM | POA: Diagnosis not present

## 2022-10-16 DIAGNOSIS — R030 Elevated blood-pressure reading, without diagnosis of hypertension: Secondary | ICD-10-CM | POA: Diagnosis not present

## 2022-10-16 DIAGNOSIS — D485 Neoplasm of uncertain behavior of skin: Secondary | ICD-10-CM | POA: Diagnosis not present

## 2022-10-16 DIAGNOSIS — Z6820 Body mass index (BMI) 20.0-20.9, adult: Secondary | ICD-10-CM | POA: Diagnosis not present

## 2022-11-01 DIAGNOSIS — H52203 Unspecified astigmatism, bilateral: Secondary | ICD-10-CM | POA: Diagnosis not present

## 2022-11-01 DIAGNOSIS — H401123 Primary open-angle glaucoma, left eye, severe stage: Secondary | ICD-10-CM | POA: Diagnosis not present

## 2022-11-01 DIAGNOSIS — H353131 Nonexudative age-related macular degeneration, bilateral, early dry stage: Secondary | ICD-10-CM | POA: Diagnosis not present

## 2022-11-01 DIAGNOSIS — H401111 Primary open-angle glaucoma, right eye, mild stage: Secondary | ICD-10-CM | POA: Diagnosis not present

## 2022-11-01 DIAGNOSIS — Z961 Presence of intraocular lens: Secondary | ICD-10-CM | POA: Diagnosis not present

## 2022-11-08 DIAGNOSIS — X32XXXD Exposure to sunlight, subsequent encounter: Secondary | ICD-10-CM | POA: Diagnosis not present

## 2022-11-08 DIAGNOSIS — L821 Other seborrheic keratosis: Secondary | ICD-10-CM | POA: Diagnosis not present

## 2022-11-08 DIAGNOSIS — L820 Inflamed seborrheic keratosis: Secondary | ICD-10-CM | POA: Diagnosis not present

## 2022-11-08 DIAGNOSIS — L570 Actinic keratosis: Secondary | ICD-10-CM | POA: Diagnosis not present

## 2023-01-12 ENCOUNTER — Other Ambulatory Visit: Payer: Self-pay | Admitting: Family Medicine

## 2023-01-12 DIAGNOSIS — Z6820 Body mass index (BMI) 20.0-20.9, adult: Secondary | ICD-10-CM | POA: Diagnosis not present

## 2023-01-12 DIAGNOSIS — N1831 Chronic kidney disease, stage 3a: Secondary | ICD-10-CM | POA: Diagnosis not present

## 2023-01-12 DIAGNOSIS — N644 Mastodynia: Secondary | ICD-10-CM | POA: Diagnosis not present

## 2023-01-12 DIAGNOSIS — Z1231 Encounter for screening mammogram for malignant neoplasm of breast: Secondary | ICD-10-CM

## 2023-01-15 ENCOUNTER — Other Ambulatory Visit: Payer: Self-pay | Admitting: Family Medicine

## 2023-01-15 DIAGNOSIS — Z1231 Encounter for screening mammogram for malignant neoplasm of breast: Secondary | ICD-10-CM

## 2023-01-15 DIAGNOSIS — N644 Mastodynia: Secondary | ICD-10-CM

## 2023-01-19 ENCOUNTER — Ambulatory Visit
Admission: RE | Admit: 2023-01-19 | Discharge: 2023-01-19 | Disposition: A | Discharge status: Home / Self Care | Payer: Medicare HMO | Source: Ambulatory Visit | Attending: Family Medicine | Admitting: Family Medicine

## 2023-01-19 DIAGNOSIS — Z1231 Encounter for screening mammogram for malignant neoplasm of breast: Secondary | ICD-10-CM

## 2023-05-04 DIAGNOSIS — H401123 Primary open-angle glaucoma, left eye, severe stage: Secondary | ICD-10-CM | POA: Diagnosis not present

## 2023-05-04 DIAGNOSIS — H401111 Primary open-angle glaucoma, right eye, mild stage: Secondary | ICD-10-CM | POA: Diagnosis not present

## 2023-06-05 DIAGNOSIS — L718 Other rosacea: Secondary | ICD-10-CM | POA: Diagnosis not present

## 2023-06-05 DIAGNOSIS — I788 Other diseases of capillaries: Secondary | ICD-10-CM | POA: Diagnosis not present

## 2023-06-05 DIAGNOSIS — L570 Actinic keratosis: Secondary | ICD-10-CM | POA: Diagnosis not present

## 2023-07-05 DIAGNOSIS — Z23 Encounter for immunization: Secondary | ICD-10-CM | POA: Diagnosis not present

## 2023-09-12 DIAGNOSIS — F5101 Primary insomnia: Secondary | ICD-10-CM | POA: Diagnosis not present

## 2023-09-12 DIAGNOSIS — M8580 Other specified disorders of bone density and structure, unspecified site: Secondary | ICD-10-CM | POA: Diagnosis not present

## 2023-09-12 DIAGNOSIS — N1831 Chronic kidney disease, stage 3a: Secondary | ICD-10-CM | POA: Diagnosis not present

## 2023-09-12 DIAGNOSIS — R195 Other fecal abnormalities: Secondary | ICD-10-CM | POA: Diagnosis not present

## 2023-09-12 DIAGNOSIS — G720 Drug-induced myopathy: Secondary | ICD-10-CM | POA: Diagnosis not present

## 2023-09-12 DIAGNOSIS — E7800 Pure hypercholesterolemia, unspecified: Secondary | ICD-10-CM | POA: Diagnosis not present

## 2023-10-16 DIAGNOSIS — Z1331 Encounter for screening for depression: Secondary | ICD-10-CM | POA: Diagnosis not present

## 2023-10-16 DIAGNOSIS — Z0000 Encounter for general adult medical examination without abnormal findings: Secondary | ICD-10-CM | POA: Diagnosis not present

## 2023-10-16 DIAGNOSIS — Z681 Body mass index (BMI) 19 or less, adult: Secondary | ICD-10-CM | POA: Diagnosis not present

## 2023-10-16 DIAGNOSIS — Z9181 History of falling: Secondary | ICD-10-CM | POA: Diagnosis not present

## 2023-10-17 DIAGNOSIS — H401111 Primary open-angle glaucoma, right eye, mild stage: Secondary | ICD-10-CM | POA: Diagnosis not present

## 2023-10-17 DIAGNOSIS — H5213 Myopia, bilateral: Secondary | ICD-10-CM | POA: Diagnosis not present

## 2023-10-17 DIAGNOSIS — H353131 Nonexudative age-related macular degeneration, bilateral, early dry stage: Secondary | ICD-10-CM | POA: Diagnosis not present

## 2023-10-17 DIAGNOSIS — H401123 Primary open-angle glaucoma, left eye, severe stage: Secondary | ICD-10-CM | POA: Diagnosis not present

## 2023-10-17 DIAGNOSIS — Z961 Presence of intraocular lens: Secondary | ICD-10-CM | POA: Diagnosis not present

## 2024-08-22 DIAGNOSIS — L309 Dermatitis, unspecified: Secondary | ICD-10-CM | POA: Diagnosis not present

## 2024-08-22 DIAGNOSIS — L270 Generalized skin eruption due to drugs and medicaments taken internally: Secondary | ICD-10-CM | POA: Diagnosis not present
